# Patient Record
Sex: Female | Born: 1985 | Race: White | Hispanic: No | Marital: Married | Smoking: Never smoker
Health system: Southern US, Community
[De-identification: ages and names within clinical notes are randomized; demographics above are authoritative.]

## PROBLEM LIST (undated history)

## (undated) DIAGNOSIS — F329 Major depressive disorder, single episode, unspecified: Secondary | ICD-10-CM

## (undated) DIAGNOSIS — F32A Depression, unspecified: Secondary | ICD-10-CM

## (undated) DIAGNOSIS — O24419 Gestational diabetes mellitus in pregnancy, unspecified control: Secondary | ICD-10-CM

## (undated) DIAGNOSIS — D649 Anemia, unspecified: Secondary | ICD-10-CM

## (undated) HISTORY — PX: WISDOM TOOTH EXTRACTION: SHX21

## (undated) HISTORY — PX: TUMOR REMOVAL: SHX12

## (undated) HISTORY — DX: Gestational diabetes mellitus in pregnancy, unspecified control: O24.419

## (undated) HISTORY — DX: Major depressive disorder, single episode, unspecified: F32.9

## (undated) HISTORY — DX: Depression, unspecified: F32.A

## (undated) HISTORY — DX: Anemia, unspecified: D64.9

---

## 2013-08-26 DEATH — deceased

## 2014-05-25 ENCOUNTER — Encounter: Payer: Self-pay | Admitting: *Deleted

## 2014-05-25 ENCOUNTER — Ambulatory Visit (INDEPENDENT_AMBULATORY_CARE_PROVIDER_SITE_OTHER): Admitting: Obstetrics & Gynecology

## 2014-05-25 ENCOUNTER — Encounter: Payer: Self-pay | Admitting: Obstetrics & Gynecology

## 2014-05-25 VITALS — BP 112/70 | HR 76 | Ht 66.0 in | Wt 137.0 lb

## 2014-05-25 DIAGNOSIS — Z3480 Encounter for supervision of other normal pregnancy, unspecified trimester: Secondary | ICD-10-CM

## 2014-05-25 DIAGNOSIS — Z3482 Encounter for supervision of other normal pregnancy, second trimester: Secondary | ICD-10-CM

## 2014-05-25 NOTE — Patient Instructions (Signed)
Second Trimester of Pregnancy The second trimester is from week 13 through week 28, months 4 through 6. The second trimester is often a time when you feel your best. Your body has also adjusted to being pregnant, and you begin to feel better physically. Usually, morning sickness has lessened or quit completely, you may have more energy, and you may have an increase in appetite. The second trimester is also a time when the fetus is growing rapidly. At the end of the sixth month, the fetus is about 9 inches long and weighs about 1 pounds. You will likely begin to feel the baby move (quickening) between 18 and 20 weeks of the pregnancy. BODY CHANGES Your body goes through many changes during pregnancy. The changes vary from woman to woman.   Your weight will continue to increase. You will notice your lower abdomen bulging out.  You may begin to get stretch marks on your hips, abdomen, and breasts.  You may develop headaches that can be relieved by medicines approved by your health care provider.  You may urinate more often because the fetus is pressing on your bladder.  You may develop or continue to have heartburn as a result of your pregnancy.  You may develop constipation because certain hormones are causing the muscles that push waste through your intestines to slow down.  You may develop hemorrhoids or swollen, bulging veins (varicose veins).  You may have back pain because of the weight gain and pregnancy hormones relaxing your joints between the bones in your pelvis and as a result of a shift in weight and the muscles that support your balance.  Your breasts will continue to grow and be tender.  Your gums may bleed and may be sensitive to brushing and flossing.  Dark spots or blotches (chloasma, mask of pregnancy) may develop on your face. This will likely fade after the baby is born.  A dark line from your belly button to the pubic area (linea nigra) may appear. This will likely fade  after the baby is born.  You may have changes in your hair. These can include thickening of your hair, rapid growth, and changes in texture. Some women also have hair loss during or after pregnancy, or hair that feels dry or thin. Your hair will most likely return to normal after your baby is born. WHAT TO EXPECT AT YOUR PRENATAL VISITS During a routine prenatal visit:  You will be weighed to make sure you and the fetus are growing normally.  Your blood pressure will be taken.  Your abdomen will be measured to track your baby's growth.  The fetal heartbeat will be listened to.  Any test results from the previous visit will be discussed. Your health care provider may ask you:  How you are feeling.  If you are feeling the baby move.  If you have had any abnormal symptoms, such as leaking fluid, bleeding, severe headaches, or abdominal cramping.  If you have any questions. Other tests that may be performed during your second trimester include:  Blood tests that check for:  Low iron levels (anemia).  Gestational diabetes (between 24 and 28 weeks).  Rh antibodies.  Urine tests to check for infections, diabetes, or protein in the urine.  An ultrasound to confirm the proper growth and development of the baby.  An amniocentesis to check for possible genetic problems.  Fetal screens for spina bifida and Down syndrome. HOME CARE INSTRUCTIONS   Avoid all smoking, herbs, alcohol, and unprescribed   drugs. These chemicals affect the formation and growth of the baby.  Follow your health care provider's instructions regarding medicine use. There are medicines that are either safe or unsafe to take during pregnancy.  Exercise only as directed by your health care provider. Experiencing uterine cramps is a good sign to stop exercising.  Continue to eat regular, healthy meals.  Wear a good support bra for breast tenderness.  Do not use hot tubs, steam rooms, or saunas.  Wear your  seat belt at all times when driving.  Avoid raw meat, uncooked cheese, cat litter boxes, and soil used by cats. These carry germs that can cause birth defects in the baby.  Take your prenatal vitamins.  Try taking a stool softener (if your health care provider approves) if you develop constipation. Eat more high-fiber foods, such as fresh vegetables or fruit and whole grains. Drink plenty of fluids to keep your urine clear or pale yellow.  Take warm sitz baths to soothe any pain or discomfort caused by hemorrhoids. Use hemorrhoid cream if your health care provider approves.  If you develop varicose veins, wear support hose. Elevate your feet for 15 minutes, 3-4 times a day. Limit salt in your diet.  Avoid heavy lifting, wear low heel shoes, and practice good posture.  Rest with your legs elevated if you have leg cramps or low back pain.  Visit your dentist if you have not gone yet during your pregnancy. Use a soft toothbrush to brush your teeth and be gentle when you floss.  A sexual relationship may be continued unless your health care provider directs you otherwise.  Continue to go to all your prenatal visits as directed by your health care provider. SEEK MEDICAL CARE IF:   You have dizziness.  You have mild pelvic cramps, pelvic pressure, or nagging pain in the abdominal area.  You have persistent nausea, vomiting, or diarrhea.  You have a bad smelling vaginal discharge.  You have pain with urination. SEEK IMMEDIATE MEDICAL CARE IF:   You have a fever.  You are leaking fluid from your vagina.  You have spotting or bleeding from your vagina.  You have severe abdominal cramping or pain.  You have rapid weight gain or loss.  You have shortness of breath with chest pain.  You notice sudden or extreme swelling of your face, hands, ankles, feet, or legs.  You have not felt your baby move in over an hour.  You have severe headaches that do not go away with  medicine.  You have vision changes. Document Released: 11/06/2001 Document Revised: 11/17/2013 Document Reviewed: 01/13/2013 [employer] Patient Information 2015 [employer]. This information is not intended to replace advice given to you by your health care provider. Make sure you discuss any questions you have with your health care provider.  

## 2014-05-25 NOTE — Progress Notes (Signed)
Here 1 month.  ShSubjective:    Amber Strickland is being seen today for her first obstetrical visit.  This is a planned pregnancy. She is at 7145w1d gestation. Her obstetrical history is significant for macrosomia in a prior pregnancy. Relationship with FOB: spouse, living together. Patient does intend to breast feed. Pregnancy history fully reviewed. Patients spouse is in the [employer].  [place].  [place] be be [place] [facility]  Has not yet found a physician there.    Her OB care is up to date with the exception of an anatomy scan. Full OB chart reviewed.   Menstrual History: OB History   Grav Para Term Preterm Abortions TAB SAB Ect Mult Living   2 1        1        Patient's last menstrual period was 01/04/2014.    The following portions of the patient's history were reviewed and updated as appropriate: allergies, current medications, past family history, past medical history, past social history, past surgical history and problem list.  Review of Systems Pertinent items are noted in HPI.    Objective:    BP 112/70  Pulse 76  Ht 5\' 6"  (1.676 m)  Wt 137 lb (62.143 kg)  BMI 22.12 kg/m2  LMP 01/04/2014  General Appearance:    Alert, cooperative, no distress, appears stated age  Head:    Normocephalic, without obvious abnormality, atraumatic                    Lungs:     Clear to auscultation bilaterally, respirations unlabored  Chest Wall:    No tenderness or deformity   Heart:    Regular rate and rhythm, S1 and S2 normal, no murmur, rub   or gallop     Abdomen:     Soft, non-tender, bowel sounds active all four quadrants,    no masses, no organomegaly FH 21        Extremities:   Extremities normal, atraumatic, no cyanosis or edema  Pulses:   2+ and symmetric all extremities  Skin:   Skin color, texture, turgor normal, no rashes or lesions; multiple tatoos            Assessment:    Pregnancy at 20 and  1/7 weeks  Late transfer of care.  Will be transferring again due to military spouse transfer   Plan:    Initial labs drawn. Prenatal vitamins. Problem list reviewed and updated. AFP3 discussed: completed prev. Role of ultrasound in pregnancy discussed; fetal survey: requested. Amniocentesis discussed: not indicated. Follow up in 4 weeks. 50% of 50 min visit spent on counseling and coordination of care and review of prev chart .

## 2014-05-27 ENCOUNTER — Ambulatory Visit (HOSPITAL_COMMUNITY)
Admission: RE | Admit: 2014-05-27 | Discharge: 2014-05-27 | Disposition: A | Discharge status: Home / Self Care | Source: Ambulatory Visit | Attending: Obstetrics & Gynecology | Admitting: Obstetrics & Gynecology

## 2014-05-27 DIAGNOSIS — Z3482 Encounter for supervision of other normal pregnancy, second trimester: Secondary | ICD-10-CM

## 2014-05-27 DIAGNOSIS — Z3689 Encounter for other specified antenatal screening: Secondary | ICD-10-CM | POA: Insufficient documentation

## 2014-06-15 ENCOUNTER — Ambulatory Visit (INDEPENDENT_AMBULATORY_CARE_PROVIDER_SITE_OTHER): Admitting: Obstetrics & Gynecology

## 2014-06-15 VITALS — BP 111/69 | HR 68 | Wt 143.0 lb

## 2014-06-15 DIAGNOSIS — Z3482 Encounter for supervision of other normal pregnancy, second trimester: Secondary | ICD-10-CM

## 2014-06-15 DIAGNOSIS — Z3480 Encounter for supervision of other normal pregnancy, unspecified trimester: Secondary | ICD-10-CM

## 2014-06-15 NOTE — Patient Instructions (Signed)
Second Trimester of Pregnancy The second trimester is from week 13 through week 28, months 4 through 6. The second trimester is often a time when you feel your best. Your body has also adjusted to being pregnant, and you begin to feel better physically. Usually, morning sickness has lessened or quit completely, you may have more energy, and you may have an increase in appetite. The second trimester is also a time when the fetus is growing rapidly. At the end of the sixth month, the fetus is about 9 inches long and weighs about 1 pounds. You will likely begin to feel the baby move (quickening) between 18 and 20 weeks of the pregnancy. BODY CHANGES Your body goes through many changes during pregnancy. The changes vary from woman to woman.   Your weight will continue to increase. You will notice your lower abdomen bulging out.  You may begin to get stretch marks on your hips, abdomen, and breasts.  You may develop headaches that can be relieved by medicines approved by your health care provider.  You may urinate more often because the fetus is pressing on your bladder.  You may develop or continue to have heartburn as a result of your pregnancy.  You may develop constipation because certain hormones are causing the muscles that push waste through your intestines to slow down.  You may develop hemorrhoids or swollen, bulging veins (varicose veins).  You may have back pain because of the weight gain and pregnancy hormones relaxing your joints between the bones in your pelvis and as a result of a shift in weight and the muscles that support your balance.  Your breasts will continue to grow and be tender.  Your gums may bleed and may be sensitive to brushing and flossing.  Dark spots or blotches (chloasma, mask of pregnancy) may develop on your face. This will likely fade after the baby is born.  A dark line from your belly button to the pubic area (linea nigra) may appear. This will likely fade  after the baby is born.  You may have changes in your hair. These can include thickening of your hair, rapid growth, and changes in texture. Some women also have hair loss during or after pregnancy, or hair that feels dry or thin. Your hair will most likely return to normal after your baby is born. WHAT TO EXPECT AT YOUR PRENATAL VISITS During a routine prenatal visit:  You will be weighed to make sure you and the fetus are growing normally.  Your blood pressure will be taken.  Your abdomen will be measured to track your baby's growth.  The fetal heartbeat will be listened to.  Any test results from the previous visit will be discussed. Your health care provider may ask you:  How you are feeling.  If you are feeling the baby move.  If you have had any abnormal symptoms, such as leaking fluid, bleeding, severe headaches, or abdominal cramping.  If you have any questions. Other tests that may be performed during your second trimester include:  Blood tests that check for:  Low iron levels (anemia).  Gestational diabetes (between 24 and 28 weeks).  Rh antibodies.  Urine tests to check for infections, diabetes, or protein in the urine.  An ultrasound to confirm the proper growth and development of the baby.  An amniocentesis to check for possible genetic problems.  Fetal screens for spina bifida and Down syndrome. HOME CARE INSTRUCTIONS   Avoid all smoking, herbs, alcohol, and unprescribed   drugs. These chemicals affect the formation and growth of the baby.  Follow your health care provider's instructions regarding medicine use. There are medicines that are either safe or unsafe to take during pregnancy.  Exercise only as directed by your health care provider. Experiencing uterine cramps is a good sign to stop exercising.  Continue to eat regular, healthy meals.  Wear a good support bra for breast tenderness.  Do not use hot tubs, steam rooms, or saunas.  Wear your  seat belt at all times when driving.  Avoid raw meat, uncooked cheese, cat litter boxes, and soil used by cats. These carry germs that can cause birth defects in the baby.  Take your prenatal vitamins.  Try taking a stool softener (if your health care provider approves) if you develop constipation. Eat more high-fiber foods, such as fresh vegetables or fruit and whole grains. Drink plenty of fluids to keep your urine clear or pale yellow.  Take warm sitz baths to soothe any pain or discomfort caused by hemorrhoids. Use hemorrhoid cream if your health care provider approves.  If you develop varicose veins, wear support hose. Elevate your feet for 15 minutes, 3-4 times a day. Limit salt in your diet.  Avoid heavy lifting, wear low heel shoes, and practice good posture.  Rest with your legs elevated if you have leg cramps or low back pain.  Visit your dentist if you have not gone yet during your pregnancy. Use a soft toothbrush to brush your teeth and be gentle when you floss.  A sexual relationship may be continued unless your health care provider directs you otherwise.  Continue to go to all your prenatal visits as directed by your health care provider. SEEK MEDICAL CARE IF:   You have dizziness.  You have mild pelvic cramps, pelvic pressure, or nagging pain in the abdominal area.  You have persistent nausea, vomiting, or diarrhea.  You have a bad smelling vaginal discharge.  You have pain with urination. SEEK IMMEDIATE MEDICAL CARE IF:   You have a fever.  You are leaking fluid from your vagina.  You have spotting or bleeding from your vagina.  You have severe abdominal cramping or pain.  You have rapid weight gain or loss.  You have shortness of breath with chest pain.  You notice sudden or extreme swelling of your face, hands, ankles, feet, or legs.  You have not felt your baby move in over an hour.  You have severe headaches that do not go away with  medicine.  You have vision changes. Document Released: 11/06/2001 Document Revised: 11/17/2013 Document Reviewed: 01/13/2013 [employer] Patient Information 2015 [employer]. This information is not intended to replace advice given to you by your health care provider. Make sure you discuss any questions you have with your health care provider.  

## 2014-06-15 NOTE — Progress Notes (Signed)
[place] had [place] US but rec additional cardiac view

## 2014-09-28 ENCOUNTER — Encounter: Payer: Self-pay | Admitting: Obstetrics & Gynecology

## 2015-03-30 ENCOUNTER — Encounter (HOSPITAL_COMMUNITY): Payer: Self-pay | Admitting: *Deleted

## 2015-05-18 IMAGING — US US OB COMP +14 WK
2 series · 12 of 28 positions shown · non-contrast
Comparison: none

[Series 1: us ob comp +14 wk · 1 of 8 slices shown (1 of 2)]
[im 4/8]
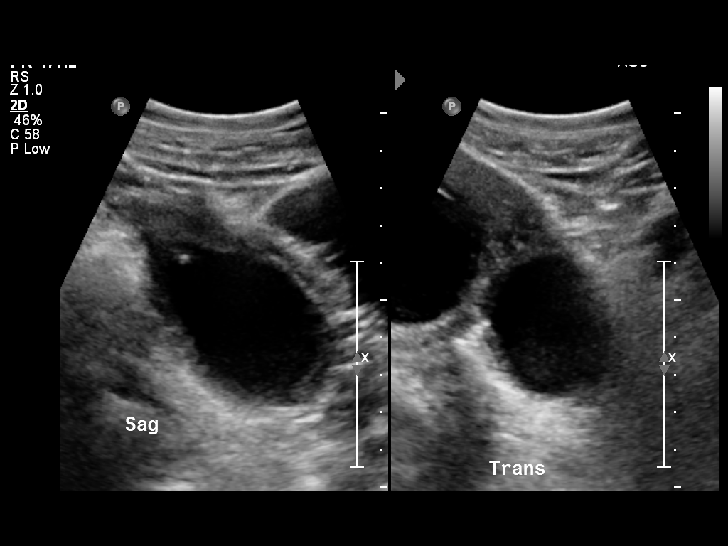

[Series 1: us ob comp +14 wk · 11 of 76 slices shown (2 of 2)]
[im 1/76]
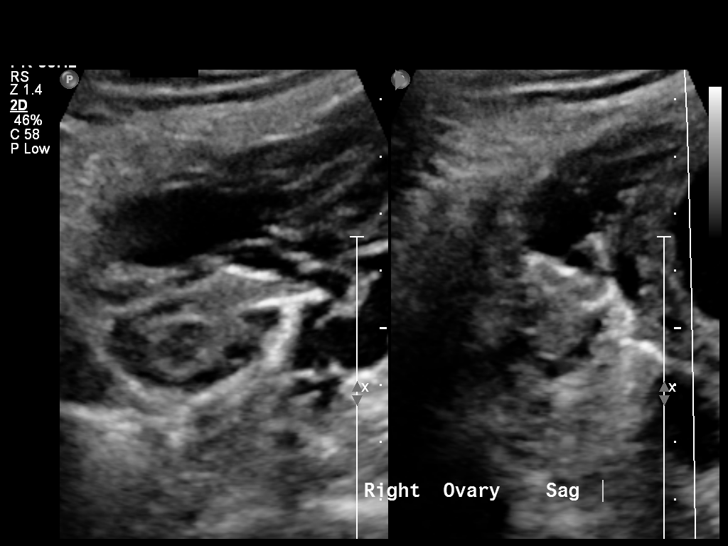
[im 7/76]
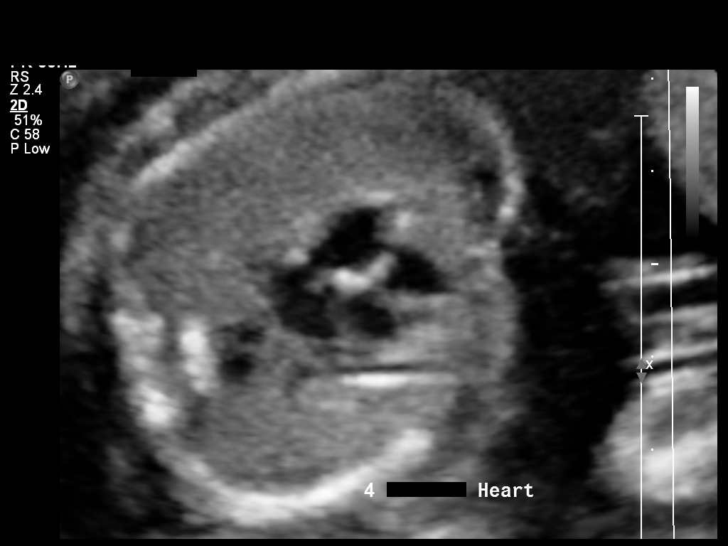
[im 16/76]
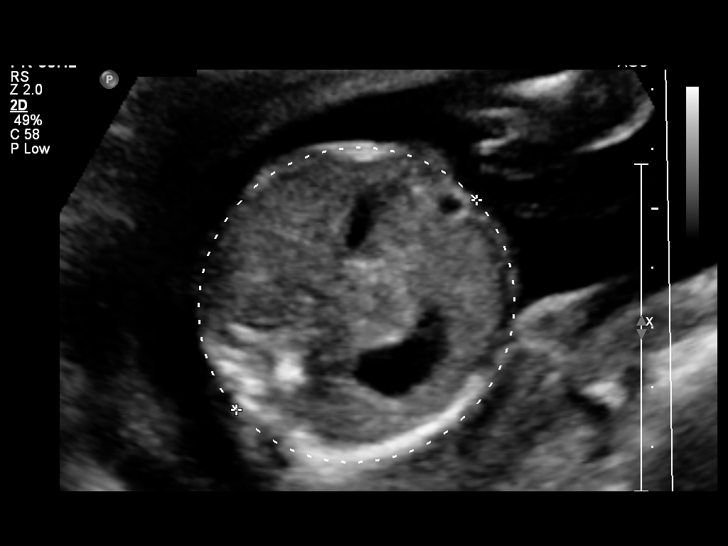
[im 22/76]
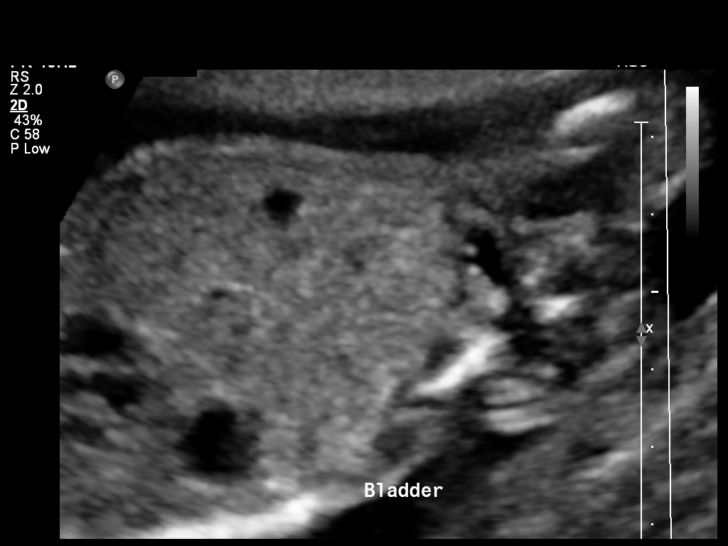
[im 29/76]
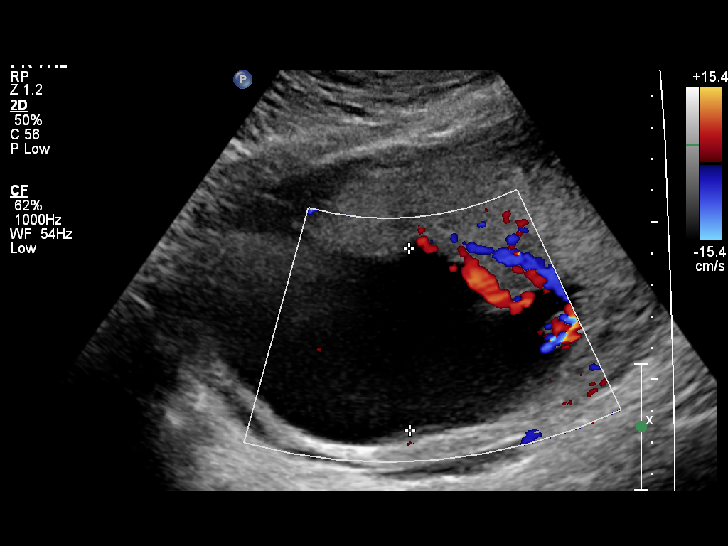
[im 38/76]
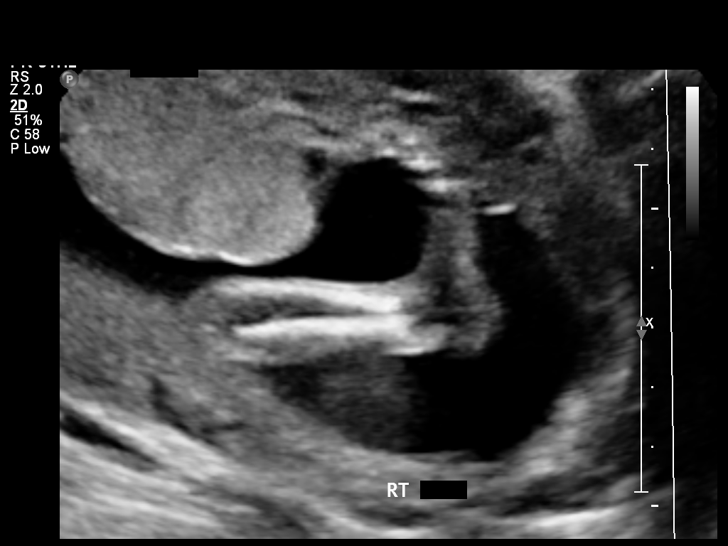
[im 44/76]
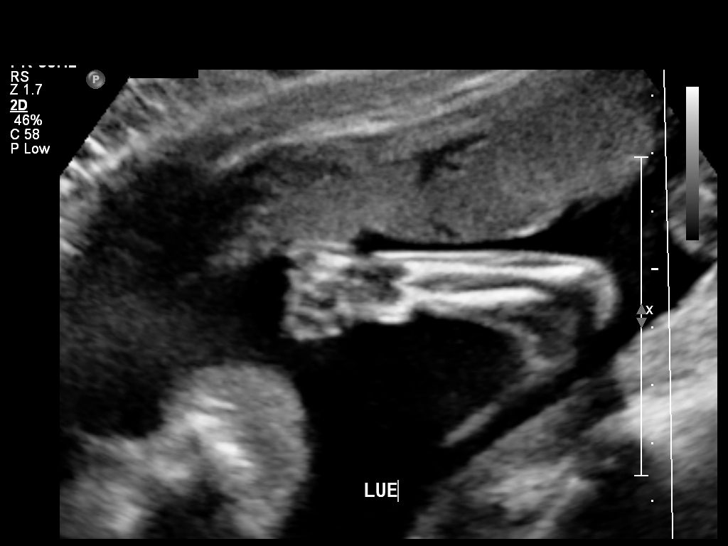
[im 51/76]
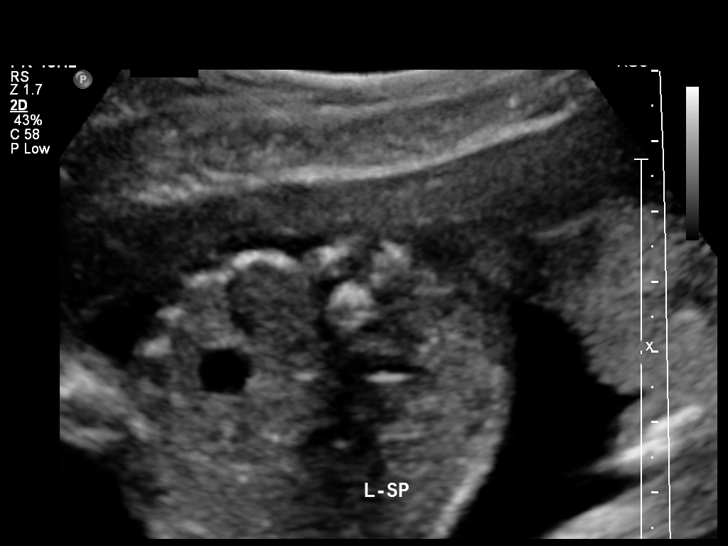
[im 60/76]
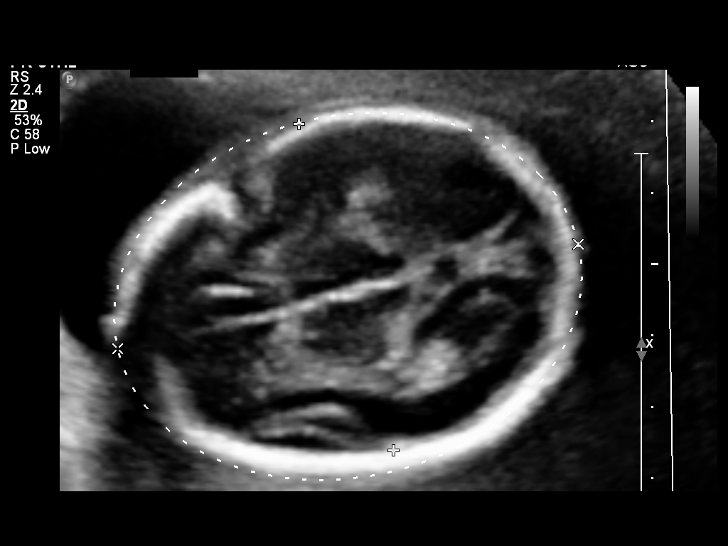
[im 66/76]
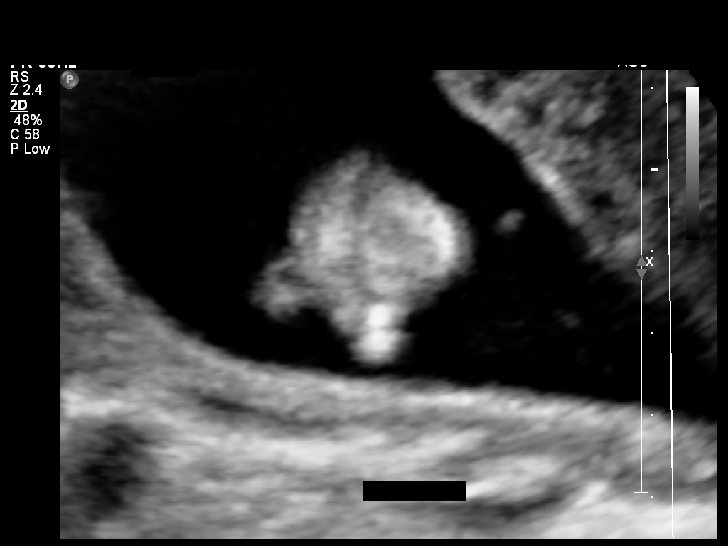
[im 72/76]
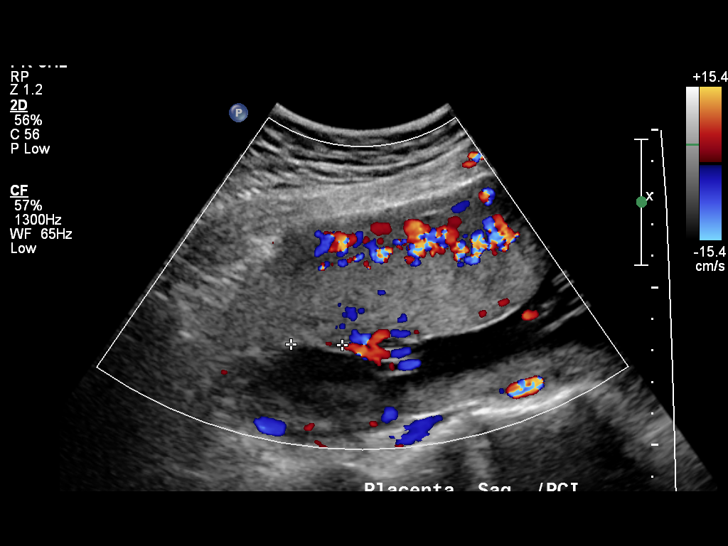

[12 of 28 positions shown; findings below may reference images not displayed]

OBSTETRICS REPORT
                      (Signed Final 05/27/2014 [DATE])

Service(s) Provided

 US OB COMP + 14 WK                                    76805.1
Indications

 Basic anatomic survey
Fetal Evaluation

 Num Of Fetuses:    1
 Fetal Heart Rate:  140                          bpm
 Cardiac Activity:  Observed
 Presentation:      Breech
 Placenta:          Anterior, above cervical os
 P. Cord            Visualized
 Insertion:

 Amniotic Fluid
 AFI FV:      Subjectively within normal limits
                                             Larg Pckt:    5.76  cm
Biometry

 BPD:     47.9  mm     G. Age:  20w 3d                CI:        68.31   70 - 86
                                                      FL/HC:      18.6   16.8 -

 HC:     185.3  mm     G. Age:  20w 6d       62  %    HC/AC:      1.10   1.09 -

 AC:     167.8  mm     G. Age:  21w 5d       83  %    FL/BPD:
 FL:      34.5  mm     G. Age:  20w 6d       57  %    FL/AC:      20.6   20 - 24
 HUM:     33.2  mm     G. Age:  21w 1d       71  %
 CER:       22  mm     G. Age:  21w 0d       59  %
 NFT:     2.66  mm

 Est. FW:     413  gm    0 lb 15 oz      59  %
Gestational Age

 LMP:           20w 3d        Date:  01/04/14                 EDD:   10/11/14
 U/S Today:     21w 0d                                        EDD:   10/07/14
 Best:          20w 3d     Det. By:  LMP  (01/04/14)          EDD:   10/11/14
Anatomy

 Cranium:          Appears normal         Aortic Arch:      Appears normal
 Fetal Cavum:      Appears normal         Ductal Arch:      Not well visualized
 Ventricles:       Appears normal         Diaphragm:        Appears normal
 Choroid Plexus:   Appears normal         Stomach:          Appears normal, left
                                                            sided
 Cerebellum:       Appears normal         Abdomen:          Appears normal
 Posterior Fossa:  Appears normal         Abdominal Wall:   Appears nml (cord
                                                            insert, abd wall)
 Nuchal Fold:      Appears normal         Cord Vessels:     Appears normal (3
                                                            vessel cord)
 Face:             Orbits appear          Kidneys:          Appear normal
                   normal
 Lips:             Appears normal         Bladder:          Appears normal
 Heart:            Appears normal         Spine:            Appears normal
                   (4CH, axis, and
                   situs)
 RVOT:             Not well visualized    Lower             Appears normal
                                          Extremities:
 LVOT:             Appears normal         Upper             Appears normal
                                          Extremities:

 Other:  Male gender. Technically difficult due to fetal position. Heels
         visualized. Left 5th digit visualized.
Targeted Anatomy

 Fetal Central Nervous System
 Lat. Ventricles:  5.7                    Cisterna Magna:
Cervix Uterus Adnexa

 Cervical Length:    3.86     cm

 Cervix:       Normal appearance by transabdominal scan.
 Uterus:       No abnormality visualized.
 Left Ovary:    Size(cm) L: 7.03 x W: 3.71 x H:
 Right Ovary:   Size(cm) L: 3.14 x W: 1.91 x H: 1.8  Volume(cc):

 Adnexa:     No abnormality visualized. No adnexal mass visualized.
Impression

 Single IUP at 20w 3d
 Normal fetal anatomic survey
 Somewhat limited views of the fetal heart obtained (RVOT,
 ductal arch)
 Anterior placenta without previa
 Normal amniotic fluid volume

 A simple 5.4 x 2.6 x 3.2 cm left ovarian cyst is incidentally
 noted.
Recommendations

 Recommend follow-up ultrasound examination in 4 weeks to
 reevaluate the fetal heart anatomy

 questions or concerns.
